# Patient Record
Sex: Male | Born: 2000 | Race: White | Hispanic: No | Marital: Single | Smoking: Never smoker
Health system: Southern US, Community
[De-identification: ages and names within clinical notes are randomized; demographics above are authoritative.]

## PROBLEM LIST (undated history)

## (undated) DIAGNOSIS — M303 Mucocutaneous lymph node syndrome [Kawasaki]: Secondary | ICD-10-CM

## (undated) HISTORY — DX: Mucocutaneous lymph node syndrome (kawasaki): M30.3

---
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
## 2022-01-03 ENCOUNTER — Telehealth: Payer: Self-pay | Admitting: Pharmacist

## 2022-01-03 NOTE — Telephone Encounter (Signed)
Patient provided results of PST FS POC INR today = 1.8 on 335mg  warfarin/WEEK. THIS weekly dose represented a REDUCTION in weekly dosage from the previous INR determination from December 31, 2021, when he had been on 340mg  warfarin/week. He denies any missed doses, no new medications, no "increase" in the consumption of sushi (contains vitamin K1). Will increase his dose to 50 milligrams warfarin per day (10 of his 5mg  strength peach-colored warfarin tablets) and have him repeat his patient self-testing, finger-stick, point-of-care INR on Monday 7-AUG-23.

## 2022-01-10 ENCOUNTER — Telehealth: Payer: Self-pay | Admitting: Pharmacist

## 2022-01-10 NOTE — Telephone Encounter (Signed)
Patient reports PST FS POC INR value >6.0, stating "I forgot to eat sushi." INR results have been impacted in "both directions" based upon his consumption of sushi (which in the wrap, contains vitamin K1.) Last weeks PST result 1.8 prompted a dose increase of 5% from 335mg  warfarin/wk to 350mg  warfarin/wk.  With no reported signs or symptoms of bleeding, I will DECREASE today's dose (vs. Omitting) to 25mg  warfarin today, 45mg  on Tu/We/Th. REPEAT PST FS POC INR on FRIDAY 11-AUG-23.

## 2022-01-17 ENCOUNTER — Ambulatory Visit (INDEPENDENT_AMBULATORY_CARE_PROVIDER_SITE_OTHER): Payer: BC Managed Care – PPO | Admitting: Pharmacist

## 2022-01-17 DIAGNOSIS — B353 Tinea pedis: Secondary | ICD-10-CM

## 2022-01-17 DIAGNOSIS — Z7901 Long term (current) use of anticoagulants: Secondary | ICD-10-CM | POA: Diagnosis not present

## 2022-01-17 DIAGNOSIS — I2541 Coronary artery aneurysm: Secondary | ICD-10-CM | POA: Diagnosis not present

## 2022-01-17 LAB — PROTIME-INR
INR: 3.9 — ABNORMAL HIGH (ref 0.8–1.2)
Prothrombin Time: 37.5 seconds — ABNORMAL HIGH (ref 11.4–15.2)

## 2022-01-17 MED ORDER — CLOTRIMAZOLE-BETAMETHASONE 1-0.05 % EX CREA: 1.0000 | TOPICAL_CREAM | Freq: Two times a day (BID) | CUTANEOUS | 0 refills | Status: AC

## 2022-01-17 NOTE — Progress Notes (Signed)
Anticoagulation Management Daniel Morgan is a 21 y.o. male who reports to the clinic for monitoring of warfarin treatment.    Indication:  Aneurysms of the coronary arteries; long term current use of oral anticoagulant, warfarin. INR target 2.0 - 3.0.   Duration: indefinite Supervising physician:  Reymundo Poll, MD  Anticoagulation Clinic Visit History: Patient does not report signs/symptoms of bleeding or thromboembolism  Other recent changes: No diet, medications, lifestyle except as noted in patient findings section.  Anticoagulation Episode Summary     Current INR goal:  2.0-3.0  TTR:  59.2 % (4.9 y)  Next INR check:  01/20/2022  INR from last check:  3.9 (01/17/2022)  Weekly max warfarin dose:    Target end date:    INR check location:  Anticoagulation Clinic  Preferred lab:    Send INR reminders to:     Indications   Coronary artery aneurysm [I25.41] Systemic vasculitis (HCC) (Resolved) [M31.8] Long term (current) use of anticoagulants [Z79.01]        Comments:          Anticoagulation Care Providers     Provider Role Specialty Phone number   Darlis Loan, MD Responsible Pediatrics [phone]       No Known Allergies  Current Outpatient Medications:    aspirin EC 81 MG tablet, Take 81 mg by mouth daily., Disp: , Rfl:    clotrimazole-betamethasone (LOTRISONE) cream, Apply 1 Application topically 2 (two) times daily for 7 days., Disp: 45 g, Rfl: 0   warfarin (COUMADIN) 5 MG tablet, Take 7 or your 5mg  strength warfarin tablets on Mondays and Thursdays. ALL OTHER DAYS, take only 6 of your 5mg  strength warfarin tablets., Disp: 176 tablet, Rfl: 2 Past Medical History:  Diagnosis Date   Kawasaki disease (HCC)    Social History   Socioeconomic History   Marital status: Single    Spouse name: Not on file   Number of children: Not on file   Years of education: Not on file   Highest education level: Not on file  Occupational History   Not on file   Tobacco Use   Smoking status: Never   Smokeless tobacco: Never  Vaping Use   Vaping Use: Never used  Substance and Sexual Activity   Alcohol use: No   Drug use: No   Sexual activity: Never  Other Topics Concern   Not on file  Social History Narrative   Is in 9th grade at [employer].   Social Determinants of Health   Financial Resource Strain: Not on file  Food Insecurity: Not on file  Transportation Needs: Not on file  Physical Activity: Not on file  Stress: Not on file  Social Connections: Not on file   Family History  Problem Relation Age of Onset   Cancer Mother    Hypertension Maternal Grandmother    Cancer Paternal Grandfather    Diabetes Paternal Grandfather    Hypertension Paternal Grandfather    Heart disease Paternal Grandfather    Cancer Maternal Grandfather     ASSESSMENT Recent Results: The most recent result is correlated with having omitted three consecutive days of warfarin due to reporting patient-self testing, finger-stick, point-of-care INR values of >6.0 on Friday 11-AUG-23; Saturday 12-AUG-23 and Sunday 13-AUG-23. No bleeding was reported on any of these three days. The patient was instructed to drink a bottle of Lipton's Green Tea containing 180 micrograms of vitamin K1 (one bottle Friday, 2 bottles Saturday and one bottle Sunday). A finger-stick POC INR  was collected on HIS device (5.0) on our device (5.1) and from the lab (venous sample, INR performed in the lab) = 3.9 Dosing resumption based upon omitted doses for the past 3 consecutive days and the pharmacokinetics of warfarin, which--in all likelihood, if not resumed today--will decline to baseline. Decision to resume warfarin is based upon that consideration.  Lab Results  Component Value Date   INR 3.9 (H) 01/17/2022   INR 3.8 (H) 09/20/2021   INR 2.2 10/15/2018    Anticoagulation Dosing: Description   Patient instructed to resume warfarin today by taking six (6) of your 5mg  strength,  peach-colored warfarin tablets today, Tuesday and Wednesday. REPEAT PST FS POC INR on Thursday, January 20, 2022 and provide results to Pharmacist, January 22, 2022.      INR today: Supratherapeutic  PLAN Weekly dose was resumed at 30mg  (6 of his 5mg  strength warfarin tablets) today, tomorrow and Wednesday. Repeat PST FS POC INR on Thursday 17-AUG-23.   Patient Instructions  Patient instructed to resume warfarin today by taking six (6) of your 5mg  strength, peach-colored warfarin tablets today, Tuesday and Wednesday. REPEAT PST FS POC INR on Thursday, January 20, 2022 and provide results to Pharmacist, .  Patient advised to contact clinic or seek medical attention if signs/symptoms of bleeding or thromboembolism occur.  Patient verbalized understanding by repeating back information and was advised to contact me if further medication-related questions arise. Patient was also provided an information handout.  Follow-up Return in 3 days (on 01/20/2022) for Follow up INR.  Sunday, PharmD, CPP  15 minutes spent face-to-face with the patient during the encounter. 50% of time spent on education, including signs/sx bleeding and clotting, as well as food and drug interactions with warfarin. 50% of time was spent on fingerprick POC INR sample collection,processing, results determination, and documentation in January 22, 2022.

## 2022-01-17 NOTE — Progress Notes (Signed)
Patient here for coumadin clinic with acute complaint of foot rash. On exam he has a focal area of erythema with scaling and maceration over the dorsal aspect of his right foot directly beneath where is sandal strap makes contact, also involving the inter digit space between his 4th and 5th digits. Rash is bothersome and occasionally itchy. Sent rx for clotrimazole betamethasone BID x 7 days.

## 2022-01-17 NOTE — Patient Instructions (Signed)
Patient instructed to resume warfarin today by taking six (6) of your 5mg  strength, peach-colored warfarin tablets today, Tuesday and Wednesday. REPEAT PST FS POC INR on Thursday, January 20, 2022 and provide results to Pharmacist, January 22, 2022.

## 2022-01-18 NOTE — Progress Notes (Signed)
INTERNAL MEDICINE TEACHING ATTENDING ADDENDUM   I agree with pharmacy recommendations as outlined in their note.   Lakeva Hollon, MD  

## 2022-01-21 ENCOUNTER — Telehealth: Payer: Self-pay | Admitting: Pharmacist

## 2022-01-21 NOTE — Telephone Encounter (Signed)
Patient provided results of PST FS POC INR result 17-AUG-23 at 2135h of 3.9 after three consecutive days of 30mg  warfarin. Patient was advised to take 25mg  on on Th 17-AUG-23; 30mg  on Fri 18-AUG-23; 25mg  on Sa 19-AUG-23; 30mg  on Su 20-AUG-23 and REPEAT patient self testing finger-stick point of care INR on Monday 21-AUG-23.

## 2022-01-26 ENCOUNTER — Telehealth: Payer: Self-pay | Admitting: Pharmacist

## 2022-01-26 NOTE — Telephone Encounter (Signed)
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 Reports PST FS POC INR of 4.1. Dose was decreased to 20mg  warfarin for Monday 21-AUG-23, Tuesday 22-AUG-23 and Wednesday 23-AUG-23 with repeat INR on Thursday 24-AUG-23.No bleeding reported.

## 2022-03-26 ENCOUNTER — Telehealth: Payer: Self-pay | Admitting: Pharmacist

## 2022-03-26 NOTE — Telephone Encounter (Signed)
Patient was instructed to take 45 milligrams of warfarin per day based on the INR of 1.8 and was to repeat the INR on Monday, 16-Oct-23. He texted on that date indicating he had left his point of care device at home. His mom was to deliver to him at [facility] on Saturday 21-OCT-23. INR was performed today upon delivery of device. Results = 1.3 though the patient acknowledges he missed yesterday's dose of 45mg . Dose was increased to 50mg  warfarin per day (10 x 5mg  tablets) and repeat INR on Thursday, 26-OCT-23.

## 2022-04-04 ENCOUNTER — Telehealth: Payer: Self-pay | Admitting: Pharmacist

## 2022-04-04 NOTE — Telephone Encounter (Signed)
Texted patient on Th 26-OCT-23 when no PST result had been received by the patient. He deferred on performing INR until the following day, Fridy 27-OCT-23.   PST FS POC INR performed Friday 27-OCT-23 was 6.0. Patient was instructed to OMIT dose for that day, resume on Saturday by taking 45mg  on Sat; Sun.   Take 50mg  on Monday 30-OCT-23. 45mg  on Tue 31-OCT-23 and Wed 1-NOV-23.   Repeat PST INR on THURSDAY 2-NOV-23.

## 2022-04-08 ENCOUNTER — Telehealth: Payer: Self-pay | Admitting: Pharmacist

## 2022-04-08 NOTE — Telephone Encounter (Signed)
Patient texted me results of PST FS POC INR value 2-NOV-23 = 1.1, stating he had missed a 50mg  dose on Wednesday 1-NOV-23. Patient was instructed to continue the regimen of 325mg  total warfarin per week and repeat PST next Friday 10-NOV-23.

## 2022-04-14 DIAGNOSIS — I2541 Coronary artery aneurysm: Secondary | ICD-10-CM | POA: Diagnosis not present

## 2022-04-14 DIAGNOSIS — M303 Mucocutaneous lymph node syndrome [Kawasaki]: Secondary | ICD-10-CM | POA: Diagnosis not present

## 2022-04-18 ENCOUNTER — Telehealth: Payer: Self-pay | Admitting: Pharmacist

## 2022-04-18 NOTE — Telephone Encounter (Signed)
Patient reports results of PST FS POC INR today = 5.3 on 325mg  warfarin/wk. Will REDUCE to 300mg  warfarin/wk (8% reduction) and repeat INR on Monday May 02, 2022.No bleeding reported.

## 2022-05-12 ENCOUNTER — Telehealth: Payer: Self-pay | Admitting: Pharmacist

## 2022-05-12 NOTE — Telephone Encounter (Signed)
Patient reports PST FS POC INR = 6.0 on 330mg  warfarin PER WEEK. Will OMIT two doses (unfortunately, has already taken todays dose; will OMIT doses on Friday 8DEC23 and Saturday 9DEC23. Resume on Sunday 10DEC23 with 40mg  (8x5mg  tablets warfarin). INR 11DEC.  Patient is a first semester Freshman at 09-23-1988 of final exams.States he has been eating less and has had weight loss. No new medications, no bleeding reported.

## 2022-05-23 ENCOUNTER — Telehealth: Payer: Self-pay | Admitting: Pharmacist

## 2022-05-23 NOTE — Telephone Encounter (Signed)
Patient provides results of FS POC PST INR today = 1.2. Will change dosing to 8 of the 5mg  strength, peach-colored warfarin tablets and repeat INR on [facility] 27-DEC-23.Final exams (Freshman semester at [employer]) are over. Hopefully he can be more compliant with dosing, diet and performing his PST FS POC INRs. Was supposed to have performed test on 11-DEC-23 after having to have omitted two doses and resumed on the third day with 40mg  (8 of the 5mg  strength, peach-colored warfarin tablets). Failed to perform the PST FS POC INR on 11-DEC-23. In midst of final examinations.

## 2022-06-09 ENCOUNTER — Ambulatory Visit (INDEPENDENT_AMBULATORY_CARE_PROVIDER_SITE_OTHER): Payer: BC Managed Care – PPO

## 2022-06-09 ENCOUNTER — Other Ambulatory Visit: Payer: Self-pay

## 2022-06-09 VITALS — BP 134/86 | HR 66 | Temp 98.2°F | Ht 66.0 in | Wt 189.2 lb

## 2022-06-09 DIAGNOSIS — J029 Acute pharyngitis, unspecified: Secondary | ICD-10-CM | POA: Insufficient documentation

## 2022-06-09 NOTE — Patient Instructions (Signed)
Daniel Morgan, it was a pleasure seeing you today! You endorsed feeling well today. Below are some of the things we talked about this visit. We look forward to seeing you in the follow up appointment!  Today we discussed: Cough: I'll call with your test results. You can keep taking robitussin, cough drops as needed. If your cough gets worse or you have more shortness of breath, give [place] a call  I have ordered the following labs today:  Lab Orders  No laboratory test(s) ordered today      Referrals ordered today:   Referral Orders  No referral(s) requested today     I have ordered the following medication/changed the following medications:   Stop the following medications: There are no discontinued medications.   Start the following medications: No orders of the defined types were placed in this encounter.    Follow-up:  N/A Keep sending INRs to Dr. Elie Confer    Please make sure to arrive 15 minutes prior to your next appointment. If you arrive late, you may be asked to reschedule.   We look forward to seeing you next time. Please call our clinic at [phone] if you have any questions or concerns. The best time to call is Monday-Friday from 9am-4pm, but there is someone available 24/7. If after hours or the weekend, call the main hospital number and ask for the Internal Medicine Resident On-Call. If you need medication refills, please notify your pharmacy one week in advance and they will send [place] a request.  Thank you for letting us take part in your care. Wishing you the best!  Thank you, Linus Galas MD

## 2022-06-09 NOTE — Assessment & Plan Note (Addendum)
Sore throat for about 1-2 weeks after he came home for break, and then started having a cough and congestion, runny nose. No fevers. Mom had bronchitis and some people in family had cough and sore throat.  Afebrile today, no dyspnea on exam, no wheezing.  No pharyngeal exudates, edema or erythema.  No anterior cervical lymphadenopathy.  Does not meet Centor criteria.  Likely has viral illness, will swab today for PCR.  Educated on conservative management and will update him on results once we have them back.

## 2022-06-10 LAB — COVID-19, FLU A+B AND RSV
Influenza A, NAA: NOT DETECTED
Influenza B, NAA: NOT DETECTED
RSV, NAA: NOT DETECTED
SARS-CoV-2, NAA: NOT DETECTED

## 2022-06-10 NOTE — Progress Notes (Signed)
   CC: Sore throat  HPI:  Mr.Bleu R Andel is a 22 y.o.-year-old male with past medical history as below presenting for sore throat.  Please see encounters tab for problem-based charting.  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  Please see encounters tab for problem based charting.  Physical Exam:  Vitals:   06/09/22 1353  BP: 134/86  Pulse: 66  Temp: 98.2 F (36.8 C)  TempSrc: Oral  SpO2: 98%  Weight: 189 lb 3.2 oz (85.8 kg)  Height: 5\' 6"  (1.676 m)   General:Well-appearing, pleasant, In NAD HEENT: No oropharyngeal lesions, no pharyngeal exudates, edema, or erythema.  No anterior cervical lymphadenopathy Cardiac: RRR, no murmurs rubs or gallops. Respiratory: Normal work of breathing on room air, CTAB Abdominal: Soft, nontender, nondistended   Assessment & Plan:   Sore throat Sore throat for about 1-2 weeks after he came home for break, and then started having a cough and congestion, runny nose. No fevers. Mom had bronchitis and some people in family had cough and sore throat.  Afebrile today, no dyspnea on exam, no wheezing.  No pharyngeal exudates or erythema.  No anterior cervical lymphadenopathy.  Does not meet Centor criteria.  Likely has viral illness, will swab today for PCR.  Educated on conservative management and will update him on results once we have them back.   Patient discussed with Dr. Evette Doffing

## 2022-06-13 NOTE — Progress Notes (Signed)
Internal Medicine Clinic Attending  Case discussed with Dr. Sridharan  At the time of the visit.  We reviewed the resident's history and exam and pertinent patient test results.  I agree with the assessment, diagnosis, and plan of care documented in the resident's note.  

## 2022-06-27 ENCOUNTER — Encounter: Payer: BC Managed Care – PPO | Admitting: Student in an Organized Health Care Education/Training Program

## 2022-07-05 ENCOUNTER — Telehealth: Payer: Self-pay | Admitting: Pharmacist

## 2022-07-05 NOTE — Telephone Encounter (Signed)
Patient provided PST FS POC INR = 1.6 on total weekly dose of warfarin of 280 milligrams/wk, on Monday 29JAN24 at 4:45pm. No missed doses he states. No signs or symptoms of bleeding or embolic events.  Will increase to 310 milligrams total per week. Repeat INR in 14 days.

## 2022-08-09 DIAGNOSIS — I2541 Coronary artery aneurysm: Secondary | ICD-10-CM | POA: Diagnosis not present

## 2022-08-09 DIAGNOSIS — M303 Mucocutaneous lymph node syndrome [Kawasaki]: Secondary | ICD-10-CM | POA: Diagnosis not present

## 2022-10-10 ENCOUNTER — Other Ambulatory Visit: Payer: Self-pay | Admitting: Pharmacist

## 2022-10-10 MED ORDER — WARFARIN SODIUM 10 MG PO TABS: ORAL_TABLET | ORAL | 2 refills | Status: DC

## 2022-10-10 NOTE — Telephone Encounter (Signed)
Requests refill on warfarin. Has been taking 310 milligrams warfarin per week as: 50 milligrams (10@5mg ) MWF, 40 milligrams (8@5mg ) all other days. INR - 1.8 (Target range 2.0 - 3.0) Just home from [place] year at [employer]. No bleeding. No missed doses. No missed doses he states. Confirms adherence to this regimen.   Will INCREASE total weekly dose to 330 milligrams warfarin per week as: 5@10mg  = 50mg  M/Tu/We/Fri/Sa 4@10mg  = 40mg  Su/Th WILL CHANGE TO 10 milligram strength warfarin tablets (Patient aware and agrees/understands this is being done to DECREASE daily pill burden.)  S[facility] per his request, [facility] [place].

## 2022-10-11 ENCOUNTER — Telehealth: Payer: Self-pay | Admitting: *Deleted

## 2022-10-11 NOTE — Telephone Encounter (Signed)
Call from [place] [facility] [address].  Questions about dosing and the amount of Coumadin . Also asking for a Diagnosis Code  Can be reached at [phone].

## 2022-10-13 DIAGNOSIS — M303 Mucocutaneous lymph node syndrome [Kawasaki]: Secondary | ICD-10-CM | POA: Diagnosis not present

## 2022-10-13 DIAGNOSIS — I2541 Coronary artery aneurysm: Secondary | ICD-10-CM | POA: Diagnosis not present

## 2022-11-08 ENCOUNTER — Telehealth: Payer: Self-pay | Admitting: Pharmacist

## 2022-11-08 NOTE — Telephone Encounter (Signed)
Patient reports PST FS POC INR results of 4.3 on 330 mg warfarin/wk as: 40 mg on SuTh; 50 mg on all other days. Reduced dose from 330 mg/wk to 310 mg/wk as: 50 mg MWF; 40 mg all other days. No bleeding. Repeat on 14JUN24. No new medications, no febrile illness.

## 2022-11-18 ENCOUNTER — Telehealth: Payer: Self-pay | Admitting: Pharmacist

## 2022-11-18 NOTE — Telephone Encounter (Signed)
Patient reports value of PST FS POC INR today 1.6 (target range 2.0 - 3.) Was supposed to have been on a 310 milligram total weekly dose regimen of warfarin but states "I may have missed yesterdays dose." i.e. he only received 270 mg warfarin. Patient was counseled regarding compliance and its importance. Patient was advised to continue same dosing regimen of 4 x 10 mg strength, white warfarin tablets on Sundays,Tuesdays, Thursdays and Saturdays; 5 x 10 mg strength, white warfarin tablets on Mondays, Wednesdays and Fridays. Asked to repeat the PST FS POC INR test on Monday 24-JUN-24. No bleeding sx/sx or chest pain, etc.

## 2022-11-28 ENCOUNTER — Telehealth: Payer: Self-pay | Admitting: Pharmacist

## 2022-11-28 NOTE — Telephone Encounter (Signed)
Patient provides results of PST FS POC INR on 310 milligrams of warfarin per week = 1.6 (target range 2.0 - 3.0). INCREASED To 340 mg per week as: 5x10mg  strength white warfarin tablets M-Sa; 4x10mg  on Sunday. Repeat INR 1JUL24. No bleeding, no missed doses, no new medications he states.

## 2022-12-05 ENCOUNTER — Telehealth: Payer: Self-pay | Admitting: Pharmacist

## 2022-12-05 NOTE — Telephone Encounter (Signed)
Received results of PST FS POC INR = 3.1 (target range 2.0 - 3.0) on 340 milligrams warfarin/week. REDUCED to 330 milligrams warfarin/wk as: Su-50mg M-40mg T-50mg W-50mg Th-40mg F-50mg Sa-50mg . Repeat INR 22-JUL-24. No bleeding. No new medications. No missed doses. Adequate supply of warfarin on hand he states.

## 2022-12-22 NOTE — Progress Notes (Unsigned)
Office Visit Note   Patient: Daniel Morgan           Date of Birth: May 28, 2001           MRN: [id number] Visit Date: 12/23/2022              Requested by: Tyson Alias, MD [address] [place],  [place] [address] PCP: Tyson Alias, MD   Assessment & Plan: Visit Diagnoses:  1. Chronic pain of right knee   2. Pain in left hand     Plan: Daniel Morgan is a 22 year old with right patellar tendinitis and strain of the left middle and ring finger flexors.  Recommend counterforce brace and Voltaren gel and relative rest and activity modification.  In regards to the fingers in regards to the fingers not concerned about a [place] fingers.  This should improve with some time.  Questions encouraged and answered.  Follow-up as needed.  Follow-Up Instructions: No follow-ups on file.   Orders:  Orders Placed This Encounter  Procedures   XR KNEE 3 VIEW RIGHT   XR Hand Complete Left   No orders of the defined types were placed in this encounter.     Procedures: No procedures performed   Clinical Data: No additional findings.   Subjective: Chief Complaint  Patient presents with   Left Knee - Pain    HPI Daniel Morgan is a 22 year old comes in for evaluation of right knee pain and left middle and ring finger pain.  In regards to the right knee denies any injuries or trauma.  He has pain during activity such as playing basketball.  He hurts right below the patella.  No pain at rest or nighttime symptoms.  In regards to the left hand he started having pain after he jumped up and grabbed a basketball net and then hold onto the neck just with his middle and ring fingers.  He felt pain across the palm and into the forearm. Review of Systems  Constitutional: Negative.   HENT: Negative.    Eyes: Negative.   Respiratory: Negative.    Cardiovascular: Negative.   Gastrointestinal: Negative.   Endocrine: Negative.   Genitourinary: Negative.   Skin: Negative.    Allergic/Immunologic: Negative.   Neurological: Negative.   Hematological: Negative.   Psychiatric/Behavioral: Negative.    All other systems reviewed and are negative.    Objective: Vital Signs: There were no vitals taken for this visit.  Physical Exam Vitals and nursing note reviewed.  Constitutional:      Appearance: He is well-developed.  HENT:     Head: Normocephalic and atraumatic.  Eyes:     Pupils: Pupils are equal, round, and reactive to light.  Pulmonary:     Effort: Pulmonary effort is normal.  Abdominal:     Palpations: Abdomen is soft.  Musculoskeletal:        General: Normal range of motion.     Cervical back: Neck supple.  Skin:    General: Skin is warm.  Neurological:     Mental Status: He is alert and oriented to person, place, and time.  Psychiatric:        Behavior: Behavior normal.        Thought Content: Thought content normal.        Judgment: Judgment normal.     Ortho Exam Examination of right knee shows tenderness to the inferior pole of the patella and the proximal patella tendon.  Exam is otherwise unremarkable.  Examination  of the left long and ring fingers show full arc of motion and normal motor and sensory function. Specialty Comments:  No specialty comments available.  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 Social History   Occupational History   Not on file  Tobacco Use   Smoking status: Never   Smokeless tobacco: Never  Vaping Use   Vaping status: Never Used  Substance and Sexual Activity   Alcohol use: No   Drug use: No   Sexual activity: Never

## 2022-12-23 ENCOUNTER — Other Ambulatory Visit: Payer: Self-pay

## 2022-12-23 ENCOUNTER — Other Ambulatory Visit (INDEPENDENT_AMBULATORY_CARE_PROVIDER_SITE_OTHER): Payer: BC Managed Care – PPO

## 2022-12-23 ENCOUNTER — Ambulatory Visit (INDEPENDENT_AMBULATORY_CARE_PROVIDER_SITE_OTHER): Payer: BC Managed Care – PPO | Admitting: Orthopaedic Surgery

## 2022-12-23 DIAGNOSIS — M25561 Pain in right knee: Secondary | ICD-10-CM | POA: Diagnosis not present

## 2022-12-23 DIAGNOSIS — G8929 Other chronic pain: Secondary | ICD-10-CM

## 2022-12-23 DIAGNOSIS — M79642 Pain in left hand: Secondary | ICD-10-CM | POA: Diagnosis not present

## 2022-12-26 ENCOUNTER — Telehealth: Payer: Self-pay | Admitting: Pharmacist

## 2022-12-26 NOTE — Telephone Encounter (Signed)
Patient provided results of PST FS POC INR performed today = 2.1 on 330 milligrams of warfarin per week. Increased to 340 milligrams per week as: 50 milligrams (5 x 10mg  tablets) all days of week EXCEPT ONLY 40mg  (4 x 10mg ) on Thursdays. Repeat PST 3wk.

## 2023-01-09 ENCOUNTER — Telehealth: Payer: Self-pay | Admitting: Pharmacist

## 2023-01-09 NOTE — Telephone Encounter (Signed)
Patient provides results of PST FS POC INR = 1.9 on 340mg  warfarin/wk. No missed doses. Adequate supply on-hand. Will increase to 350mg  warfarin per week as: 5x10mg  strength warfarin tablets (50mg ) PO QD. Repeat INR 26AUG24.

## 2023-01-26 ENCOUNTER — Telehealth: Payer: Self-pay | Admitting: Pharmacist

## 2023-01-26 MED ORDER — WARFARIN SODIUM 10 MG PO TABS: 50.0000 mg | ORAL_TABLET | Freq: Every day | ORAL | 2 refills | Status: DC

## 2023-01-26 NOTE — Telephone Encounter (Signed)
Patient called me while driving to [facility] requesting refill on warfarin. Have arrived to campus and will send [facility] to [facility].

## 2023-02-01 ENCOUNTER — Telehealth: Payer: Self-pay | Admitting: Pharmacist

## 2023-02-01 NOTE — Telephone Encounter (Signed)
Patient two-days late in reporting PST FS POC INR results on 350 mg/warfarin per week as 5 x 10 mg strength white-colored warfarin tablets by mouth, once-daily.   INR reported today = 5.5 on dose reported above. Patient as advised to OMIT today's dose, resume warfarin tomorrow, Thursday 29-AUG-24 by taking 40 mg (4 x 10 mg strength white-colored warfarin tablets by mouth, once-daily) on Thursdays, Saturdays, and Mondays; Take 50 mg (5 x 10 mg strength white-colored warfarin tablets on Fridays, Sundays, Tuesdays and Wednesdays.   Patient advised to OMIT today's dose and drink one bottle of Lipton tea/eat a dark-green leafy salad. Observe closely for signs of bleeding, e.g. blood in urine, stool, nose-bleeds, throwing up blood, coughing up blood, bleeding anywhere--including increased bruising. Report any sudden onset of headaches/visual changes/nausea or vomiting--or report to an Emergency Room on campus/in-[place] at [facility].

## 2023-02-16 ENCOUNTER — Telehealth: Payer: Self-pay | Admitting: Pharmacist

## 2023-02-16 NOTE — Telephone Encounter (Signed)
Patient provides result of PST FS POC INR = 2.5 (Target range 2.0 - 3.0) on 50mg  warfarin Su/Tue/Wed/Fri; 40mg  Mon/Th/Sat. Advised to CONTINUE same regimen, re-test Wednesday October, 2, 2024. No bleeding, no missed doses, no new medications.

## 2023-03-23 ENCOUNTER — Telehealth: Payer: Self-pay | Admitting: Pharmacist

## 2023-03-23 NOTE — Telephone Encounter (Signed)
Patient reports PST FS POC INR = 1.9 on 320 mg warfarin/wk. No missed doses. Will increase to 330 mg warrfarin/wk as: 50mg  (5x10mg  strength tablets) all days of week EXCEPT on Mon/Sat, take only 4 tablets (40mg ). Repeat INR [id number].

## 2023-04-10 ENCOUNTER — Telehealth: Payer: Self-pay | Admitting: Pharmacist

## 2023-04-10 NOTE — Telephone Encounter (Signed)
Patient reports PST FS POC value for today while on 330 mg warfarin per week = 1.9 (again). Will increase from 330 mg warfarin/wk to 350 mg warfarin/wk (six percent increase in total weekly dosage.) No bleeding, no new medications, no missed doses.

## 2023-04-19 DIAGNOSIS — Z79899 Other long term (current) drug therapy: Secondary | ICD-10-CM | POA: Diagnosis not present

## 2023-04-19 DIAGNOSIS — L700 Acne vulgaris: Secondary | ICD-10-CM | POA: Diagnosis not present

## 2023-04-24 ENCOUNTER — Telehealth: Payer: Self-pay | Admitting: Pharmacist

## 2023-04-24 NOTE — Telephone Encounter (Signed)
Patient provides PST FS POC INR results of 2.5 (target range 2.0 - 3.0) on 50 mg warfarin per day (5 x 10 mg strength, white colored warfarin tablets). No missed doses, no bleeding, no new meds. Adequate supply. Repeat [id number].

## 2023-05-08 ENCOUNTER — Telehealth: Payer: Self-pay | Admitting: Pharmacist

## 2023-05-08 NOTE — Telephone Encounter (Signed)
Patient provides results of PST FS POC INR determination collected today = 1.9, on 350mg  warfarin/wk. Will increase to 370mg  warfarin/wk (as: 60mg  M/Th; 50mg  all other days). Repeat [id number].

## 2023-05-29 ENCOUNTER — Telehealth: Payer: Self-pay | Admitting: Pharmacist

## 2023-05-29 NOTE — Telephone Encounter (Signed)
Pt reports results of PST FS POC INR = 1.7, citing MISSED DOSE (50 mg of warfarin) on Sunday 22-DEC-24. Intended dose 370 mg/wk. INCREASED to 380 mg warfarin/wk as: 60 mg warfarin (6x10mg  tabs) on MWF; 50 mg (5x10mg  tabs) all other days. Repeat INR 13JAN25 No bleeding, no new medications.

## 2023-06-27 ENCOUNTER — Telehealth: Payer: Self-pay | Admitting: Pharmacist

## 2023-06-27 NOTE — Telephone Encounter (Signed)
Pt provided results of PST FS POC INR 1 week late. INR = 6.0.No bleeding, no new medications. No febrile illness. Instructed to OMIT dose on 20JAN25. Resume Tuesday [id number] take 50 mg (5x10mg  strength warfarin) all days of the week except on Wednesdays--take 45mg  4.5 x 10 mg warfarin), repeat INR 3FEB25.

## 2023-06-29 ENCOUNTER — Encounter: Payer: Self-pay | Admitting: *Deleted

## 2023-07-10 ENCOUNTER — Telehealth: Payer: Self-pay | Admitting: Pharmacist

## 2023-07-10 NOTE — Telephone Encounter (Signed)
Patient provided results of PST FS POC INR = 5.9 (target range 2.0 - 3.0) No new medications, no febrile illness (hypermetabolic state sees increased clearence of the VKDCF's). Eating less dark green leafy vegetables he states. Was asked to resume his normal dietary consumption of dark green leafy foods. I inquired about "hypermetabolic states" (e.g. febrile illness=NO; excessive work-outs in the gym=maybe--advised to do his normal routine). Will HOLD/OMIT today's warfarin dose as planned (minus 50 mg); resume tomorrow with 45 mg warfarin every day as 4.5 x 10 mg strength warfarin tablets, Tuesday through next Sunday 9-FEB-25. Repeat PST FS POC INR determination on Monday 10-FEB-25. Any signs or symptoms suggesting increased bleeding, report to [facility] or the local ED at [facility].

## 2023-07-17 ENCOUNTER — Telehealth: Payer: Self-pay | Admitting: Pharmacist

## 2023-07-17 NOTE — Telephone Encounter (Signed)
 Received texted PST FS POC INR results of 3.9 (Target range 2.0 - 3.0) on 315 mg total warfarin per week. No bleeding reported, no new medications. Will reduce by ~ 10% to 290 mg total warfarin per week as: 40mg  (4x10mg  strength warfarin tablets) M-F. Take 45mg  (4 and 1/2 x 10 mg strength warfarin tablets) on Saturdays and Sundays. Repeat PST, FS, POC INR determination next on Monday 24-FEB-25.

## 2023-07-21 DIAGNOSIS — I2541 Coronary artery aneurysm: Secondary | ICD-10-CM | POA: Diagnosis not present

## 2023-07-21 DIAGNOSIS — M25562 Pain in left knee: Secondary | ICD-10-CM | POA: Diagnosis not present

## 2023-07-21 DIAGNOSIS — I498 Other specified cardiac arrhythmias: Secondary | ICD-10-CM | POA: Diagnosis not present

## 2023-07-21 DIAGNOSIS — S80812A Abrasion, left lower leg, initial encounter: Secondary | ICD-10-CM | POA: Diagnosis not present

## 2023-07-21 DIAGNOSIS — D72829 Elevated white blood cell count, unspecified: Secondary | ICD-10-CM | POA: Diagnosis not present

## 2023-07-21 DIAGNOSIS — Z041 Encounter for examination and observation following transport accident: Secondary | ICD-10-CM | POA: Diagnosis not present

## 2023-07-21 DIAGNOSIS — R0602 Shortness of breath: Secondary | ICD-10-CM | POA: Diagnosis not present

## 2023-07-21 DIAGNOSIS — Z7901 Long term (current) use of anticoagulants: Secondary | ICD-10-CM | POA: Diagnosis not present

## 2023-07-21 DIAGNOSIS — R001 Bradycardia, unspecified: Secondary | ICD-10-CM | POA: Diagnosis not present

## 2023-07-21 DIAGNOSIS — S299XXA Unspecified injury of thorax, initial encounter: Secondary | ICD-10-CM | POA: Diagnosis not present

## 2023-07-21 DIAGNOSIS — M25561 Pain in right knee: Secondary | ICD-10-CM | POA: Diagnosis not present

## 2023-07-21 DIAGNOSIS — M303 Mucocutaneous lymph node syndrome [Kawasaki]: Secondary | ICD-10-CM | POA: Diagnosis not present

## 2023-07-21 DIAGNOSIS — S80811A Abrasion, right lower leg, initial encounter: Secondary | ICD-10-CM | POA: Diagnosis not present

## 2023-07-21 DIAGNOSIS — R791 Abnormal coagulation profile: Secondary | ICD-10-CM | POA: Diagnosis not present

## 2023-07-31 LAB — POCT INR

## 2023-08-30 ENCOUNTER — Telehealth: Payer: Self-pay | Admitting: Pharmacist

## 2023-08-30 NOTE — Telephone Encounter (Signed)
 Pt. texted me results of PST FS POC INR = 1.8 on 295 mg warfarin/wk as 45 mg MWF; 40 mg all other days. No missed doses. Will increase to 45 mg warfarin per day. Repeat INR 14APR25.

## 2023-09-18 ENCOUNTER — Telehealth: Payer: Self-pay | Admitting: Pharmacist

## 2023-09-18 NOTE — Telephone Encounter (Signed)
 Pt provided results of PST FS POC INR = 3.3 on 315 mg warfarin per week (as 4&1/2 x 10mg  white warfarin tablets) by mouth daily. Will DECREASE to 4 x 10 mg white warfarin tablets M/W/F; 4 & 1/2 tablets ALL OTHER DAYS. No bleeding, no missed doses. No new medications. Decrease from 315 mg warfarin per week to 300 mg warfarin per week is ~ 5 percent decrease in dose.

## 2023-10-16 ENCOUNTER — Telehealth: Payer: Self-pay | Admitting: Pharmacist

## 2023-10-16 NOTE — Telephone Encounter (Signed)
 Pt reports by text his FS POC PST INR of 1.5 on 300 mg warfarin/wk as: Su-45mg M-40mg T-45mg W-40mg Th-45mg F-40mg Sa-45mg . MISSED Sunday 05/11 45 mg dose. Will use same regimen--w/NO MISSED DOSES and re-evaluate [id number]. Pt is advised of the same.

## 2023-11-06 ENCOUNTER — Telehealth: Payer: Self-pay | Admitting: Pharmacist

## 2023-11-06 NOTE — Telephone Encounter (Signed)
 Pt reports result of PST FS POC INR 3.7 on 300 mg warfarin/wk as:40 mg (4x10mg ) MWF; 45 mg (4&1/2 x 10mg ) SuTuThSa. No bleeding, no new medications, no missed doses. Advised to DECREASE dose to: 35 mg (3& 1/2 x 10mg ) M/Th; 40mg  (4x10mg ) Su/Tu/We/Fri/Sat. Repeat PST FS POC INR in 3 weeks on 23-JUN-25 ~ "steady-state" on new dosing regimen. Patient instructed to report to me any signs/symptoms of bleeding, e.g. blood in urine, stool, nosebleeds, throwing up blood, coughing up blood,increased bruising, etc.

## 2023-11-22 ENCOUNTER — Other Ambulatory Visit: Payer: Self-pay | Admitting: Pharmacist

## 2023-11-22 MED ORDER — WARFARIN SODIUM 10 MG PO TABS: ORAL_TABLET | ORAL | 2 refills | Status: DC

## 2023-11-22 NOTE — Telephone Encounter (Signed)
 Patient requests refill on his warfarin. Currently using 10 mg strength white-colored warfarin tablets. Takes 4x10mg  on Su/Tu/We/Fr/Sa; 3&1/2 x 10 mg on Monday/Thursday = total 270 mg warfarin/wk. No bleeding. No new meds. Will send refill authorization to [facility].

## 2023-12-05 DIAGNOSIS — M303 Mucocutaneous lymph node syndrome [Kawasaki]: Secondary | ICD-10-CM | POA: Diagnosis not present

## 2023-12-19 ENCOUNTER — Telehealth: Payer: Self-pay | Admitting: Pharmacist

## 2023-12-19 NOTE — Telephone Encounter (Signed)
 Contacted by patient reporting FS POC PST INR = 6.0 taking Su-40mg M-35mg T-40mg W-40mg Th-35mg F-40mg Sa-40mg  warfarin as shown. Had taken the days dose. Advised to OMIT dose for Tuesday 15JUL25, eat a dark green leafy salad for dinner and purchase two bottles of Lipton's Green Tea with approximately 88 micrograms of Vitamin K1 per bottle, and consume contents of both bottles. Patient was advised to OMIT dose for TUESDAY 15JUL25 and repeat PST FS POC INR today, Tuesday 15JUL25. He has not yet reported the results. I have texted him to do so. Yesterday, he denied any bleeding from any site or source, no bruising, no new medications, no extra doses of warfarin. I will await his INR testing for today and make a clinical decision as to the management/dosing going forward.

## 2023-12-22 ENCOUNTER — Telehealth: Payer: Self-pay | Admitting: Pharmacist

## 2023-12-22 NOTE — Telephone Encounter (Signed)
 INR on 12/18/23 *(recorded on Tuesday, 15-JULY-25) was 6.0. The Patient was advised not to take the dose for Monday, 14-JULY-25, but he had already taken the dose. Was advised to not take the dose for TUESDAY 15-JULY-25 and to eat a dark green salad for dinner MONDAY evening, 14-JULY-25; to purchase 1-2 bottles of Lipton's Green Tea and drink contents of each, and--to repeat the PST FS POC INR on Tuesday 15-JULY-25. Mon 14-JUL-25 INR 6.0 HOLD; No bleeding reported. Tue 15-JUL-25 INR 6.0 HOLD;  No bleeding reported. Wed 16-JUL-25 INR 3.8 15mg ;  No bleeding reported.  Thu 17-JUL-25 INR 2.4 40mg  17JUL, and 18JUL, Repeat INR 19JUL and report value to me. No bleeding reported. Dosage Out:  F-40mg Sa-inr INR to be performed (patient self testing, finger-stick, point of care at home) on Saturday 19-JUL-25.  Lynwood Lites, PharmD, CPP Clinical Pharmacist Practitioner

## 2023-12-25 ENCOUNTER — Telehealth: Payer: Self-pay | Admitting: Pharmacist

## 2023-12-25 NOTE — Telephone Encounter (Signed)
 PST FS POC INR 2.6 (Target range 2.0 - 3.0). Given fhe following instructions: 50 mg on Monday/Thursday; 40 mg all other days. -Repeat PST FS POC INR on 28-JUL-25. No bleeding; no new medications.No febrile illness or sickness.

## 2024-01-08 DIAGNOSIS — M303 Mucocutaneous lymph node syndrome [Kawasaki]: Secondary | ICD-10-CM | POA: Diagnosis not present

## 2024-01-15 ENCOUNTER — Telehealth: Payer: Self-pay | Admitting: Pharmacist

## 2024-01-15 NOTE — Telephone Encounter (Signed)
 Pt reports result of INR from PST FS POC determination = 2.90 on 300 mg warfarin/wk. No bleeding, no chest pain. No new medications, no missed or extra-doses. DECREASED to 290 mg warfarin/wk as 45 mg M/Th; 40 mg SuTuWeFri/Sa/Sun. Repeat INR 15-SEP-25.   Lynwood Lites, PharmD, CPP Clinical Pharmacist Practitioner

## 2024-02-19 ENCOUNTER — Other Ambulatory Visit: Payer: Self-pay | Admitting: Pharmacist

## 2024-02-19 ENCOUNTER — Telehealth: Payer: Self-pay | Admitting: Pharmacist

## 2024-02-19 MED ORDER — WARFARIN SODIUM 10 MG PO TABS: ORAL_TABLET | ORAL | 1 refills | Status: DC

## 2024-02-19 NOTE — Telephone Encounter (Signed)
 Pt reported result of PST FS POC test = 1.5 but states missed two consecutive days dosing of warfarin this week along with having eaten sushi over the w/e (High vitamin K1 content). Advised to resume regular dosing schema last provided, I.e. using his 10 mg strength white colored warfarin tablets, take 4 x 10 mg strength tablets on Sundays, Tuesdays, Wednesdays, Fridays and Saturdays; Take 4 & 1/2 x 10 mg strength tablets on Mondays and Thursdays for a total weekly warfarin dose of 290 mg warfarin/wk. Repeat PST FS POC INR determination in 2 weeks (steady state after resuming from two consecutive doses missed) on Monday, September 29th, 2025. Text or call me results.

## 2024-02-19 NOTE — Telephone Encounter (Signed)
 Requests warfarin ref[facility] [address], [place]. Patient currently taking 40 mg (4 x 10 mg tablets) on Su/Tu/We/Fri/Sa; and 45 mg (4&1/2 x 10 mg tablets) on Mon/Thurs = 29 tabs/wk x 4 weeks. Requires 116 of the 10 mg, white colored warfarin tablets per month. Will send this prescription authorization to pharmacy provided above. One refill.

## 2024-04-29 ENCOUNTER — Telehealth: Payer: Self-pay | Admitting: Pharmacist

## 2024-04-29 ENCOUNTER — Other Ambulatory Visit: Payer: Self-pay | Admitting: Pharmacist

## 2024-04-29 MED ORDER — WARFARIN SODIUM 10 MG PO TABS: ORAL_TABLET | ORAL | 1 refills | Status: AC

## 2024-04-29 NOTE — Telephone Encounter (Signed)
 Patient reports results of PST FS POC INR = 2.1 on 290 mg warfarin per week 4 & 1/2 x 10 mg strength white colored warfarin tablets on Mondays and Fridays; 4 x 10 mg white colored warfarin tablets all other days with no bleeding reported. Seeking refill authorization as well which I will do.   Lynwood Lites, PharmD, CPP Clinical Pharmacist Practitioner
